# Patient Record
Sex: Female | Born: 2000 | Race: White | Hispanic: No | Marital: Single | Smoking: Current every day smoker
Health system: Southern US, Community
[De-identification: ages and names within clinical notes are randomized; demographics above are authoritative.]

---
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
## 2018-09-19 ENCOUNTER — Encounter (HOSPITAL_COMMUNITY): Payer: Self-pay | Admitting: Emergency Medicine

## 2018-09-19 ENCOUNTER — Other Ambulatory Visit: Payer: Self-pay

## 2018-09-19 ENCOUNTER — Emergency Department (HOSPITAL_COMMUNITY)
Admission: EM | Admit: 2018-09-19 | Discharge: 2018-09-19 | Disposition: A | Discharge status: Home / Self Care | Payer: BLUE CROSS/BLUE SHIELD | Attending: Emergency Medicine | Admitting: Emergency Medicine

## 2018-09-19 DIAGNOSIS — W19XXXA Unspecified fall, initial encounter: Secondary | ICD-10-CM

## 2018-09-19 DIAGNOSIS — W010XXA Fall on same level from slipping, tripping and stumbling without subsequent striking against object, initial encounter: Secondary | ICD-10-CM | POA: Insufficient documentation

## 2018-09-19 DIAGNOSIS — F17290 Nicotine dependence, other tobacco product, uncomplicated: Secondary | ICD-10-CM | POA: Diagnosis not present

## 2018-09-19 DIAGNOSIS — Y998 Other external cause status: Secondary | ICD-10-CM | POA: Diagnosis not present

## 2018-09-19 DIAGNOSIS — S00502A Unspecified superficial injury of oral cavity, initial encounter: Secondary | ICD-10-CM | POA: Diagnosis present

## 2018-09-19 DIAGNOSIS — S01512A Laceration without foreign body of oral cavity, initial encounter: Secondary | ICD-10-CM | POA: Insufficient documentation

## 2018-09-19 DIAGNOSIS — Y929 Unspecified place or not applicable: Secondary | ICD-10-CM | POA: Diagnosis not present

## 2018-09-19 DIAGNOSIS — Y9389 Activity, other specified: Secondary | ICD-10-CM | POA: Diagnosis not present

## 2018-09-19 MED ORDER — LIDOCAINE HCL (PF) 1 % IJ SOLN
5.0000 mL | Freq: Once | INTRAMUSCULAR | Status: AC
  Administered 2018-09-19: 5 mL
  Filled 2018-09-19: qty 30

## 2018-09-19 MED ORDER — CEPHALEXIN 500 MG PO CAPS: 500.0000 mg | ORAL_CAPSULE | Freq: Two times a day (BID) | ORAL | 0 refills | Status: AC

## 2018-09-19 MED ORDER — LIDOCAINE VISCOUS HCL 2 % MT SOLN
15.0000 mL | Freq: Once | OROMUCOSAL | Status: AC
  Administered 2018-09-19: 15 mL via OROMUCOSAL
  Filled 2018-09-19: qty 15

## 2018-09-19 NOTE — ED Provider Notes (Signed)
[facility]-EMERGENCY DEPT Provider Note   CSN: [id number] Arrival date & time: 09/19/18  1819    History   Chief Complaint Chief Complaint  Patient presents with  . Fall  . Mouth Injury    HPI Meagan Pierce is a 18 y.o. female who presents to the ED s/p fall. Patient reports she was going up the steps today and fell and bit the inside of her cheek on the left side. The injury occurred about 5 pm. Patient reports being up to date on tetanus.     HPI  History reviewed. No pertinent past medical history.  There are no active problems to display for this patient.   History reviewed. No pertinent surgical history.   OB History   No obstetric history on file.      Home Medications    Prior to Admission medications   Medication Sig Start Date End Date Taking? Authorizing Provider  cephALEXin (KEFLEX) 500 MG capsule Take 1 capsule (500 mg total) by mouth 2 (two) times daily. 09/19/18   Janne Napoleon, NP  clindamycin (CLEOCIN) 150 MG capsule Take 1 capsule (150 mg total) by mouth 4 (four) times daily. 09/08/16   Dione Booze, MD  HYDROcodone-acetaminophen (NORCO) 5-325 MG tablet Take 1 tablet by mouth every 4 (four) hours as needed for moderate pain. 09/08/16   Dione Booze, MD  PENICILLIN V POTASSIUM PO Take by mouth.    [provider]    Family History No family history on file.  Social History Social History   Tobacco Use  . Smoking status: Current Every Day Smoker    Types: E-cigarettes  . Smokeless tobacco: Never Used  Substance Use Topics  . Alcohol use: Not on file  . Drug use: Not on file     Allergies   Patient has no known allergies.   Review of Systems Review of Systems  HENT:       Laceration mouth  Skin: Positive for wound.  All other systems reviewed and are negative.    Physical Exam Updated Vital Signs BP 122/80   Pulse 84   Temp 98 F (36.7 C) (Oral)   Resp 16   Ht 5\' 1"  (1.549 m)   Wt 85 lb (38.6 kg)    LMP 08/31/2018   SpO2 100%   BMI 16.06 kg/m   Physical Exam Vitals signs and nursing note reviewed.  Constitutional:      General: She is not in acute distress.    Appearance: Normal appearance. She is well-developed.  HENT:     Head: Normocephalic.     Right Ear: Tympanic membrane normal.     Left Ear: Tympanic membrane normal.     Nose: Nose normal.     Mouth/Throat:     Mouth: Mucous membranes are moist.     Comments: 2 cm gaping laceration to the inside of the left cheek.  Eyes:     Extraocular Movements: Extraocular movements intact.     Conjunctiva/sclera: Conjunctivae normal.     Pupils: Pupils are equal, round, and reactive to light.  Neck:     Musculoskeletal: Neck supple.  Cardiovascular:     Rate and Rhythm: Normal rate.  Pulmonary:     Effort: Pulmonary effort is normal.  Abdominal:     Palpations: Abdomen is soft.     Tenderness: There is no abdominal tenderness.  Musculoskeletal: Normal range of motion.  Skin:    General: Skin is warm and dry.  Neurological:  Mental Status: She is alert and oriented to person, place, and time.     Cranial Nerves: No cranial nerve deficit.  Psychiatric:        Mood and Affect: Mood normal.      ED Treatments / Results  Labs (all labs ordered are listed, but only abnormal results are displayed) Labs Reviewed - No data to display  Radiology No results found.  Procedures .Marland KitchenLaceration Repair Date/Time: 09/19/2018 9:39 PM Performed by: Janne Napoleon, NP Authorized by: Janne Napoleon, NP   Consent:    Consent obtained:  Verbal   Consent given by:  Patient   Risks discussed:  Poor wound healing   Alternatives discussed:  No treatment Anesthesia (see MAR for exact dosages):    Anesthesia method:  Topical application and local infiltration   Local anesthetic:  Lidocaine 1% w/o epi Laceration details:    Location:  Mouth   Mouth location:  L buccal mucosa   Length (cm):  2 Repair type:    Repair type:   Simple Pre-procedure details:    Preparation:  Patient was prepped and draped in usual sterile fashion and imaging obtained to evaluate for foreign bodies Exploration:    Hemostasis achieved with:  Direct pressure   Wound exploration: entire depth of wound probed and visualized     Wound extent: no foreign bodies/material noted   Treatment:    Area cleansed with:  Saline   Amount of cleaning:  Standard   Irrigation solution:  Sterile saline   Irrigation method:  Syringe Skin repair:    Repair method:  Sutures   Suture size:  6-0   Suture material:  Fast-absorbing gut   Suture technique:  Simple interrupted   Number of sutures:  2 Approximation:    Approximation:  Close Post-procedure details:    Dressing:  Open (no dressing)   Patient tolerance of procedure:  Tolerated well, no immediate complications   (including critical care time)  Medications Ordered in ED Medications  lidocaine (XYLOCAINE) 2 % viscous mouth solution 15 mL (15 mLs Mouth/Throat Given 09/19/18 2005)  lidocaine (PF) (XYLOCAINE) 1 % injection 5 mL (5 mLs Infiltration Given by Other 09/19/18 2119)     Initial Impression / Assessment and Plan / ED Course  I have reviewed the triage vital signs and the nursing notes. 18 y.o. female here with laceration inside mouth due to tooth going in the mucous membrane of the cheek. Stable for d/c without other injuries. Return precautions discussed.   Final Clinical Impressions(s) / ED Diagnoses   Final diagnoses:  Laceration of internal mouth, initial encounter  Fall, initial encounter    ED Discharge Orders         Ordered    cephALEXin (KEFLEX) 500 MG capsule  2 times daily     09/19/18 2145           Kerrie Buffalo [place], NP 09/20/18 1258    Mancel Bale, MD 09/20/18 1330

## 2018-09-19 NOTE — ED Triage Notes (Signed)
Pt reports she tripped going up stairs today and fell and bit the inside of her cheek. Pt c/o pain and afraid will need stitches.

## 2018-09-19 NOTE — Discharge Instructions (Signed)
Take tylenol or ibuprofen as needed for pain. Return as needed for any problems.

## 2019-06-06 ENCOUNTER — Other Ambulatory Visit: Payer: Self-pay

## 2019-06-06 DIAGNOSIS — Z20822 Contact with and (suspected) exposure to covid-19: Secondary | ICD-10-CM

## 2019-06-08 LAB — NOVEL CORONAVIRUS, NAA: SARS-CoV-2, NAA: NOT DETECTED
